# Patient Record
Sex: Male | Born: 1990 | Race: White | Hispanic: No | Marital: Single | Smoking: Current every day smoker
Health system: Southern US, Community
[De-identification: ages and names within clinical notes are randomized; demographics above are authoritative.]

## PROBLEM LIST (undated history)

## (undated) HISTORY — PX: APPENDECTOMY: SHX54

---

## 2020-11-09 ENCOUNTER — Other Ambulatory Visit: Payer: Self-pay

## 2020-11-09 ENCOUNTER — Ambulatory Visit
Admission: RE | Admit: 2020-11-09 | Discharge: 2020-11-09 | Disposition: A | Discharge status: Home / Self Care | Payer: BC Managed Care – PPO | Source: Ambulatory Visit | Attending: Physician Assistant | Admitting: Physician Assistant

## 2020-11-09 ENCOUNTER — Ambulatory Visit (INDEPENDENT_AMBULATORY_CARE_PROVIDER_SITE_OTHER): Payer: BC Managed Care – PPO

## 2020-11-09 VITALS — BP 141/82 | HR 73 | Temp 98.2°F | Resp 18 | Ht 67.0 in | Wt 140.0 lb

## 2020-11-09 DIAGNOSIS — R0782 Intercostal pain: Secondary | ICD-10-CM | POA: Diagnosis not present

## 2020-11-09 DIAGNOSIS — R079 Chest pain, unspecified: Secondary | ICD-10-CM

## 2020-11-09 DIAGNOSIS — R0602 Shortness of breath: Secondary | ICD-10-CM

## 2020-11-09 DIAGNOSIS — T50B95A Adverse effect of other viral vaccines, initial encounter: Secondary | ICD-10-CM | POA: Diagnosis not present

## 2020-11-09 LAB — FIBRIN DERIVATIVES D-DIMER (ARMC ONLY): Fibrin derivatives D-dimer (ARMC): 157.67 ng/mL (FEU) (ref 0.00–499.00)

## 2020-11-09 MED ORDER — METHYLPREDNISOLONE 4 MG PO TBPK: ORAL_TABLET | ORAL | 0 refills | Status: AC

## 2020-11-09 MED ORDER — METHYLPREDNISOLONE 4 MG PO TBPK: ORAL_TABLET | ORAL | 0 refills | Status: DC

## 2020-11-09 NOTE — ED Triage Notes (Signed)
Pt c/o chest pain and shortness of breath since having his covid vaccines. He states he had the first moderna, then went away but came back with the 2nd vaccine. He states the pain is on the left side under his ribs.

## 2020-11-09 NOTE — ED Provider Notes (Signed)
[facility]    CSN: [id number] Arrival date & time: 11/09/20  1634      History   Chief Complaint Chief Complaint  Patient presents with   Appointment   Chest Pain   Shortness of Breath    HPI Greg Collins is a 29 y.o. male presents today due to having unresolved intermittent  L lateral and L anterior chest tightness since he received his 1st moderna covid injection on 10/10 and his second one 11/12. The pain waxes and wanes, but never resolves. Has felt SOB, but also admits seems worse when stressed at work. Has hx of bradycardia in the 40's which was noted when he tried to go donate plasma in the past, but is not an athlete. There was one day that he had to hold on to the area of pain and work bent over due to the discomfort. Denies abdominal pain, n/v. Nothing makes the pain better or worse. Deep breaths do not provoke the pain.   History reviewed. No pertinent past medical history.  There are no problems to display for this patient.   Past Surgical History:  Procedure Laterality Date   APPENDECTOMY      Home Medications    Prior to Admission medications   Not on File    Family History Family History  Problem Relation Age of Onset   Healthy Mother    Healthy Father     Social History Social History   Tobacco Use   Smoking status: Current Every Day Smoker    Packs/day: 0.50    Types: Cigarettes   Smokeless tobacco: Never Used  Vaping Use   Vaping Use: Former  Substance Use Topics   Alcohol use: Not Currently   Drug use: Not Currently     Allergies   Patient has no known allergies.   Review of Systems Review of Systems  Constitutional: Negative for activity change, chills, fatigue and fever.  HENT: Positive for congestion.   Respiratory: Positive for chest tightness and shortness of breath. Negative for cough and wheezing.        Chest tightness described as " feels like muscle fibers are pulling apart"  Cardiovascular:  Positive for chest pain. Negative for palpitations and leg swelling.  Gastrointestinal: Negative for abdominal pain.  Musculoskeletal: Negative for gait problem.  Skin: Negative for rash.  Neurological: Negative for dizziness, syncope, weakness, light-headedness and numbness.  Hematological: Negative for adenopathy.  Psychiatric/Behavioral:       Admits feeling stressed at work     Physical Exam Triage Vital Signs ED Triage Vitals  Enc Vitals Group     BP 11/09/20 1648 (!) 141/82     Pulse Rate 11/09/20 1648 73     Resp 11/09/20 1648 18     Temp 11/09/20 1648 98.2 F (36.8 C)     Temp Source 11/09/20 1648 Oral     SpO2 11/09/20 1648 99 %     Weight 11/09/20 1646 140 lb (63.5 kg)     Height 11/09/20 1646 5\' 7"  (1.702 m)     Head Circumference --      Peak Flow --      Pain Score 11/09/20 1646 5     Pain Loc --      Pain Edu? --      Excl. in GC? --    No data found.  Updated Vital Signs BP (!) 141/82 (BP Location: Left Arm)    Pulse 73    Temp 98.2 F (  36.8 C) (Oral)    Resp 18    Ht 5\' 7"  (1.702 m)    Wt 140 lb (63.5 kg)    SpO2 99%    BMI 21.93 kg/m   Visual Acuity Right Eye Distance:   Left Eye Distance:   Bilateral Distance:    Right Eye Near:   Left Eye Near:    Bilateral Near:     Physical Exam Vitals and nursing note reviewed.  Constitutional:      Appearance: He is normal weight.  HENT:     Head: Normocephalic.  Eyes:     Extraocular Movements: Extraocular movements intact.  Neck:     Thyroid: No thyromegaly.  Cardiovascular:     Rate and Rhythm: Normal rate and regular rhythm.     Heart sounds: No murmur heard.  No diastolic murmur is present.   Pulmonary:     Effort: Pulmonary effort is normal.     Breath sounds: Normal breath sounds.  Chest:     Chest wall: No deformity, tenderness or crepitus.     Comments: I was unable to provoke his pain with chest wall tenderness on area of discomfort Abdominal:     Palpations: Abdomen is soft. There  is no hepatomegaly or splenomegaly.     Tenderness: There is no abdominal tenderness.  Musculoskeletal:        General: Normal range of motion.     Cervical back: Neck supple.  Skin:    General: Skin is warm and dry.     Findings: No rash.  Neurological:     Mental Status: He is alert and oriented to person, place, and time.  Psychiatric:        Mood and Affect: Mood normal.        Behavior: Behavior normal.     UC Treatments / Results  Labs (all labs ordered are listed, but only abnormal results are displayed) Labs Reviewed - No data to display  EKG Sinus brady with marked sinus arrhythmia. Has incomplete RBBB  Radiology No results found.  Procedures Procedures (including critical care time)  Medications Ordered in UC Medications - No data to display  Initial Impression / Assessment and Plan / UC Course  I have reviewed the triage vital signs and the nursing notes. Unchanged and persistent L chest pain x 1 month sine Moderna Covid injection. His CXR is negative. D-dimer is normal. I offered him to try Medrol dose pack in case is deep inner chest wall inflammation. Needs to FU with cardiology.  Pertinent labs & imaging results that were available during my care of the patient were reviewed by me and considered in my medical decision making (see chart for details).  Final Clinical Impressions(s) / UC Diagnoses   Final diagnoses:  None   Discharge Instructions   None    ED Prescriptions    None     PDMP not reviewed this encounter.   , PA-C 11/10/20 1147

## 2020-11-09 NOTE — Discharge Instructions (Addendum)
Your EKG shows slow hear( bradycardia) and incomplete right bunddle branch block. When right bundle branch block is detected, it can be classified as either a complete block or an incomplete block based on ECG findings. An incomplete block means that electrical signals are being conducted better than in a complete block. I would like you to try some prednisone to help possible inflammation of the lining of the lung.  Please follow up with cardiology if it persists

## 2022-05-24 IMAGING — CR DG CHEST 2V
3 series · 3 of 3 positions shown · non-contrast
Comparison: None.

CLINICAL DATA: Pt c/o chest pain and shortness of breath since
having his covid vaccines. He states he had the first moderna, then
went away but came back with the 2nd vaccine. He states the pain is
on the left side under his ribsSOB abd L lateral chest pain

EXAM:
CHEST - 2 VIEW

[chest pa (1 of 2)]
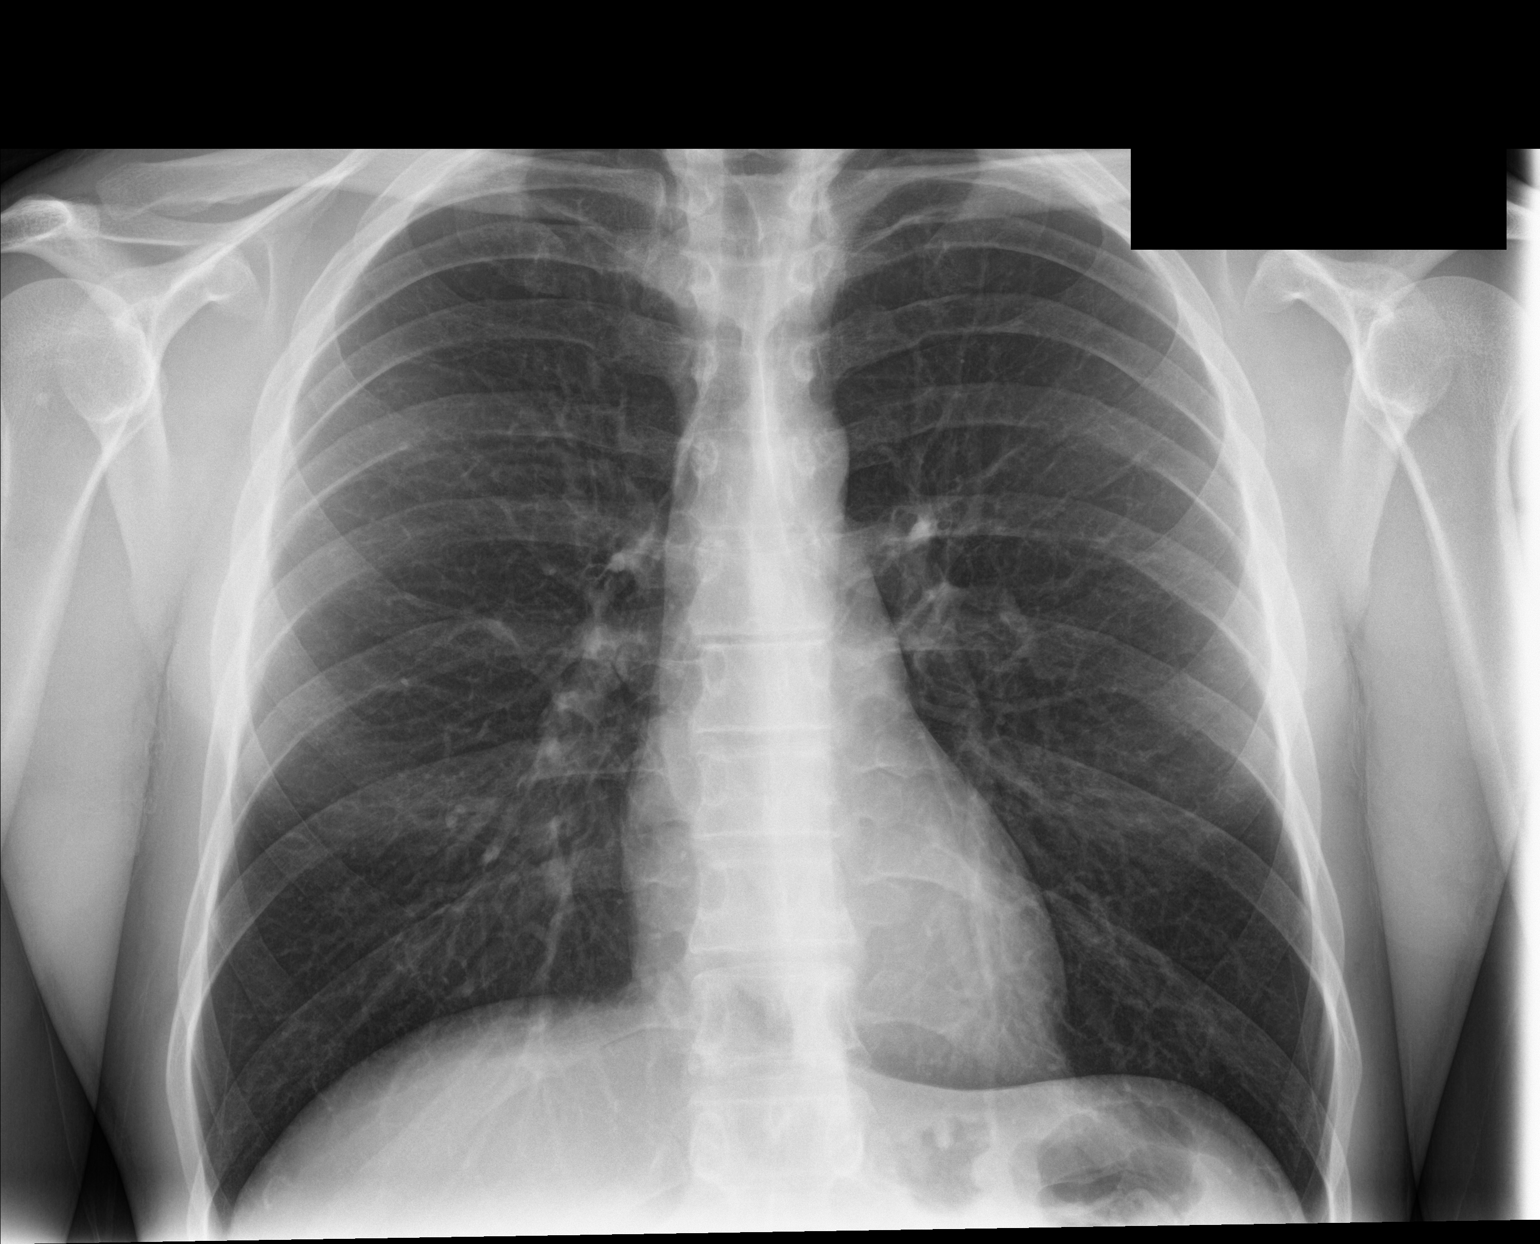

[chest lat]
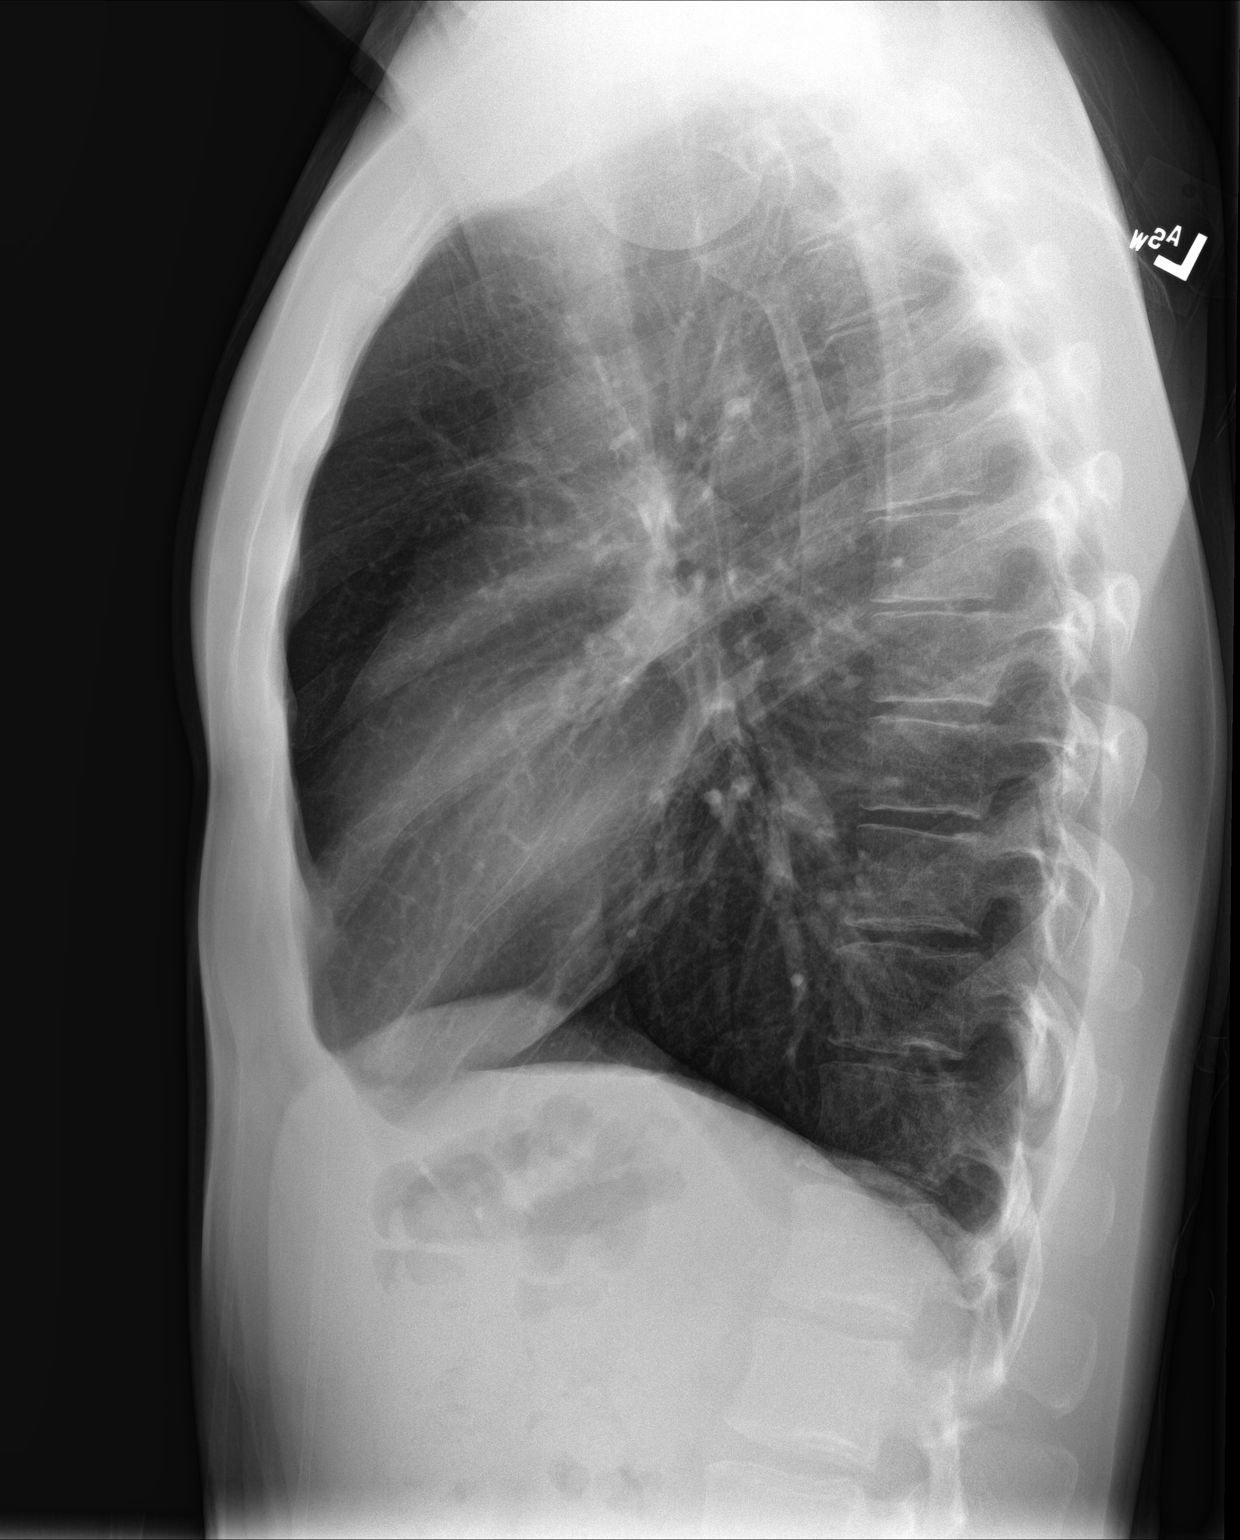

[chest pa (2 of 2)]
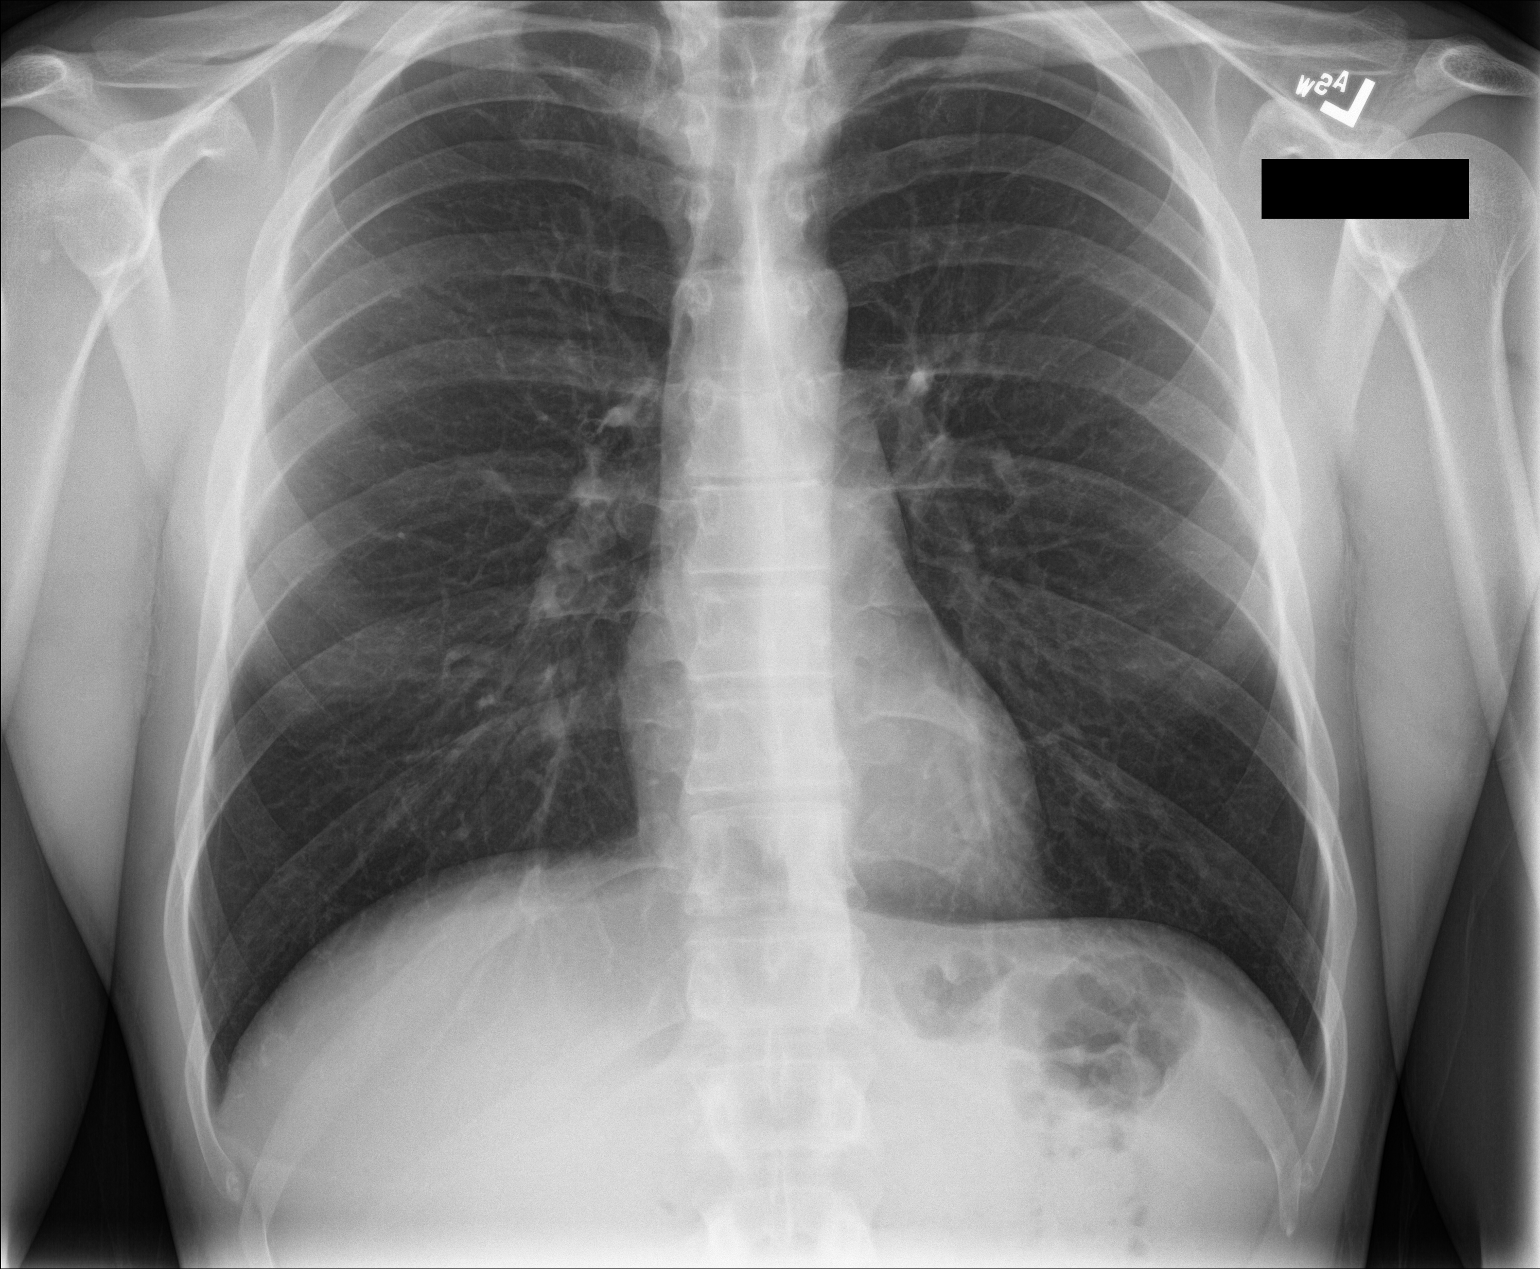

[3 of 3 positions shown; findings below may reference images not displayed]

FINDINGS: Normal mediastinum and cardiac silhouette. Normal pulmonary
vasculature. No evidence of effusion, infiltrate, or pneumothorax.
No acute bony abnormality.
IMPRESSION: Normal chest radiograph.
